# Patient Record
Sex: Male | Born: 1985 | Race: White | Hispanic: No | Marital: Married | Smoking: Never smoker
Health system: Southern US, Community
[De-identification: ages and names within clinical notes are randomized; demographics above are authoritative.]

---

## 2019-03-29 ENCOUNTER — Emergency Department (HOSPITAL_BASED_OUTPATIENT_CLINIC_OR_DEPARTMENT_OTHER)
Admission: EM | Admit: 2019-03-29 | Discharge: 2019-03-30 | Disposition: A | Discharge status: Home / Self Care | Payer: BLUE CROSS/BLUE SHIELD | Attending: Emergency Medicine | Admitting: Emergency Medicine

## 2019-03-29 ENCOUNTER — Encounter (HOSPITAL_BASED_OUTPATIENT_CLINIC_OR_DEPARTMENT_OTHER): Payer: Self-pay

## 2019-03-29 ENCOUNTER — Other Ambulatory Visit: Payer: Self-pay

## 2019-03-29 DIAGNOSIS — L259 Unspecified contact dermatitis, unspecified cause: Secondary | ICD-10-CM | POA: Diagnosis not present

## 2019-03-29 DIAGNOSIS — R21 Rash and other nonspecific skin eruption: Secondary | ICD-10-CM | POA: Diagnosis present

## 2019-03-29 MED ORDER — DIPHENHYDRAMINE HCL 25 MG PO TABS: 25.0000 mg | ORAL_TABLET | Freq: Four times a day (QID) | ORAL | 0 refills | Status: DC

## 2019-03-29 MED ORDER — HYDROCORTISONE 1 % EX CREA: TOPICAL_CREAM | CUTANEOUS | 0 refills | Status: AC

## 2019-03-29 MED ORDER — NAPROXEN 500 MG PO TABS: 500.0000 mg | ORAL_TABLET | Freq: Two times a day (BID) | ORAL | 0 refills | Status: AC

## 2019-03-29 MED ORDER — DIPHENHYDRAMINE HCL 25 MG PO CAPS
25.0000 mg | ORAL_CAPSULE | Freq: Once | ORAL | Status: AC
  Administered 2019-03-30: 25 mg via ORAL
  Filled 2019-03-29: qty 1

## 2019-03-29 MED ORDER — DIPHENHYDRAMINE HCL 25 MG PO TABS: 25.0000 mg | ORAL_TABLET | Freq: Four times a day (QID) | ORAL | 0 refills | Status: AC

## 2019-03-29 MED ORDER — HYDROCORTISONE 1 % EX CREA: TOPICAL_CREAM | CUTANEOUS | 0 refills | Status: DC

## 2019-03-29 MED ORDER — NAPROXEN 250 MG PO TABS
500.0000 mg | ORAL_TABLET | Freq: Once | ORAL | Status: AC
  Administered 2019-03-30: 500 mg via ORAL
  Filled 2019-03-29: qty 2

## 2019-03-29 NOTE — ED Provider Notes (Signed)
[facility] EMERGENCY DEPARTMENT Provider Note   CSN: [id number] Arrival date & time: 03/29/19  2136     History   Chief Complaint Chief Complaint  Patient presents with  . Rash    HPI Raymond Gibbs is a 33 y.o. male.     HPI  This is a 33 year old male with no significant past medical history presents with a rash to the right arm.  Patient reports that he was mowing the lawn when his right arm got caught in breast Alem.  He states that since that time he has noted a rash in his right forearm.  It is uncomfortable and at times itchy.  He is unsure if he got bitten.  He has used hydrocortisone cream with some relief.  He also used ice with some relief.  No rash anywhere else.  Denies systemic symptoms.  Currently he rates the discomfort at 6 out of 10.  He reports that he feels like his hand feels strange as well.  Denies weakness but reports tingling.  Denies fevers.  History reviewed. No pertinent past medical history.  There are no active problems to display for this patient.   History reviewed. No pertinent surgical history.      Home Medications    Prior to Admission medications   Medication Sig Start Date End Date Taking? Authorizing Provider  diphenhydrAMINE (BENADRYL) 25 MG tablet Take 1 tablet (25 mg total) by mouth every 6 (six) hours. 03/29/19   Jahden Schara, Barbette Hair, MD  hydrocortisone cream 1 % Apply to affected area 2 times daily 03/29/19   Graham Doukas, Barbette Hair, MD  naproxen (NAPROSYN) 500 MG tablet Take 1 tablet (500 mg total) by mouth 2 (two) times daily. 03/29/19   Debbera Wolken, Barbette Hair, MD    Family History No family history on file.  Social History Social History   Tobacco Use  . Smoking status: Never Smoker  . Smokeless tobacco: Current User    Types: Snuff  Substance Use Topics  . Alcohol use: Never    Frequency: Never  . Drug use: Never     Allergies   Bee venom and Penicillins   Review of Systems Review of Systems  Constitutional:  Negative for fever.  Skin: Positive for color change and rash. Negative for wound.  All other systems reviewed and are negative.    Physical Exam Updated Vital Signs BP (!) 138/99 (BP Location: Right Arm)   Pulse (!) 57   Temp 98.6 F (37 C) (Oral)   Resp 18   Ht 1.905 m (6\' 3" )   Wt 88.5 kg   SpO2 100%   BMI 24.37 kg/m   Physical Exam Vitals signs and nursing note reviewed.  Constitutional:      Appearance: He is well-developed. He is not ill-appearing.  HENT:     Head: Normocephalic and atraumatic.     Mouth/Throat:     Mouth: Mucous membranes are moist.  Cardiovascular:     Rate and Rhythm: Normal rate and regular rhythm.  Pulmonary:     Effort: Pulmonary effort is normal. No respiratory distress.  Musculoskeletal:        General: No tenderness, deformity or signs of injury.  Skin:    General: Skin is warm and dry.     Comments: Blanching erythema noted over the anterior aspect of the proximal right forearm, large patch of erythema, not raised, no bite marks or other skin changes noted  Neurological:     Mental Status: He is  alert and oriented to person, place, and time.  Psychiatric:        Mood and Affect: Mood normal.      ED Treatments / Results  Labs (all labs ordered are listed, but only abnormal results are displayed) Labs Reviewed - No data to display  EKG None  Radiology No results found.  Procedures Procedures (including critical care time)  Medications Ordered in ED Medications  naproxen (NAPROSYN) tablet 500 mg (has no administration in time range)  diphenhydrAMINE (BENADRYL) capsule 25 mg (has no administration in time range)     Initial Impression / Assessment and Plan / ED Course  I have reviewed the triage vital signs and the nursing notes.  Pertinent labs & imaging results that were available during my care of the patient were reviewed by me and considered in my medical decision making (see chart for details).         Patient presents with skin discoloration and rash following contact with a tree limb.  Denies significant injury.  He has a large patch of erythema.  Not raised.  Does not look like urticaria.  Suspect may be a contact dermatitis.  No indication of a bite.  Regardless, recommend continuing topical steroid, Benadryl and anti-inflammatories for itching and discomfort.  After history, exam, and medical workup I feel the patient has been appropriately medically screened and is safe for discharge home. Pertinent diagnoses were discussed with the patient. Patient was given return precautions.   Final Clinical Impressions(s) / ED Diagnoses   Final diagnoses:  Contact dermatitis, unspecified contact dermatitis type, unspecified trigger    ED Discharge Orders         Ordered    hydrocortisone cream 1 %     03/29/19 2352    naproxen (NAPROSYN) 500 MG tablet  2 times daily     03/29/19 2352    diphenhydrAMINE (BENADRYL) 25 MG tablet  Every 6 hours     03/29/19 2353           Shon BatonHorton, Rueben Kassim F, MD 03/29/19 2357

## 2019-03-29 NOTE — Discharge Instructions (Addendum)
You were seen today for a rash.  This is likely related to coming in contact irritating her skin.  Take Benadryl or naproxen as needed for discomfort.  Additionally continue hydrocortisone.  Monitor for signs and symptoms of worsening.

## 2019-03-29 NOTE — ED Triage Notes (Signed)
Pt caught a tree branch in his elbow and thinks he received a bug bite. Pt has an area of urticaria to his forearm that is painful. Pt applied hydrocortisone and ice to the area PTA.

## 2019-03-29 NOTE — ED Notes (Signed)
Pt states arm was pressed into a tree branch and immediately felt a pain in his arm, then rash and pain. Pt reports no sighting of any insect or snake, or anything that may have bitten him. Pt emphasizes that arm is "burning"

## 2021-10-05 ENCOUNTER — Other Ambulatory Visit (HOSPITAL_COMMUNITY): Payer: Self-pay | Admitting: Podiatry

## 2021-10-05 DIAGNOSIS — M25572 Pain in left ankle and joints of left foot: Secondary | ICD-10-CM

## 2021-10-05 DIAGNOSIS — M25571 Pain in right ankle and joints of right foot: Secondary | ICD-10-CM

## 2021-10-16 ENCOUNTER — Ambulatory Visit (HOSPITAL_COMMUNITY)
Admission: RE | Admit: 2021-10-16 | Discharge: 2021-10-16 | Disposition: A | Discharge status: Home / Self Care | Payer: No Typology Code available for payment source | Source: Ambulatory Visit | Attending: Podiatry | Admitting: Podiatry

## 2021-10-16 ENCOUNTER — Other Ambulatory Visit: Payer: Self-pay

## 2021-10-16 ENCOUNTER — Encounter (HOSPITAL_COMMUNITY): Payer: Self-pay

## 2021-10-16 DIAGNOSIS — M25572 Pain in left ankle and joints of left foot: Secondary | ICD-10-CM

## 2021-10-16 DIAGNOSIS — M25571 Pain in right ankle and joints of right foot: Secondary | ICD-10-CM

## 2021-10-31 ENCOUNTER — Ambulatory Visit (HOSPITAL_COMMUNITY): Admission: RE | Admit: 2021-10-31 | Payer: No Typology Code available for payment source | Source: Ambulatory Visit

## 2021-10-31 ENCOUNTER — Encounter (HOSPITAL_COMMUNITY): Payer: Self-pay

## 2021-11-02 ENCOUNTER — Ambulatory Visit (HOSPITAL_COMMUNITY)
Admission: RE | Admit: 2021-11-02 | Discharge: 2021-11-02 | Disposition: A | Discharge status: Home / Self Care | Payer: No Typology Code available for payment source | Source: Ambulatory Visit | Attending: Podiatry | Admitting: Podiatry

## 2021-11-02 DIAGNOSIS — M25571 Pain in right ankle and joints of right foot: Secondary | ICD-10-CM | POA: Insufficient documentation

## 2021-11-02 DIAGNOSIS — M25572 Pain in left ankle and joints of left foot: Secondary | ICD-10-CM | POA: Insufficient documentation

## 2021-11-02 IMAGING — MR MR ANKLE*L* W/O CM
5 series · 39 of 40 positions shown · non-contrast
Comparison: None.

CLINICAL DATA: Left ankle pain, unspecified chronicity. History of
fracture.

EXAM:
MRI OF THE LEFT ANKLE WITHOUT CONTRAST
TECHNIQUE: Multiplanar, multisequence MR imaging of the ankle was performed. No
intravenous contrast was administered.

[Series 2: T2 fat-sat · axial · left · 4.0mm · 0.50mm/px · z∈[-72,+92]mm · 8 of 34 slices shown (1 of 2)]
[im 1/34]
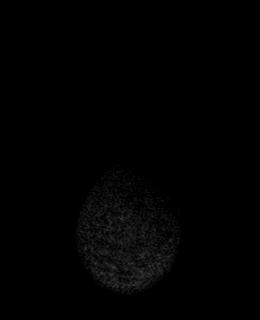
[im 5/34]
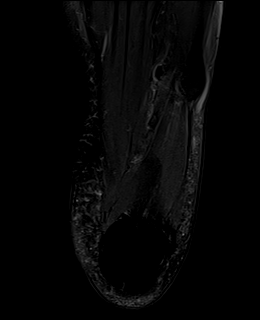
[im 10/34]
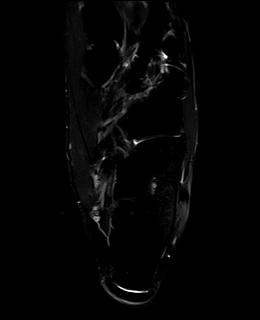
[im 15/34]
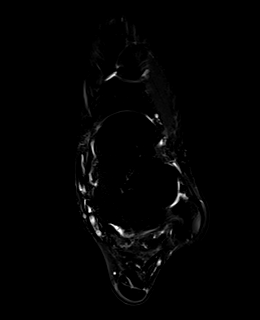
[im 19/34]
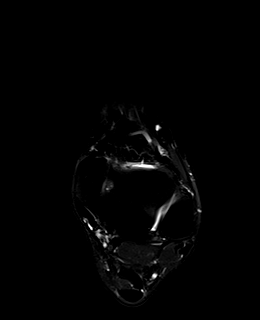
[im 24/34]
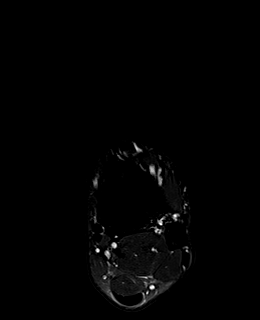
[im 29/34]
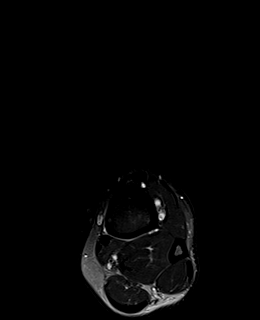
[im 34/34]
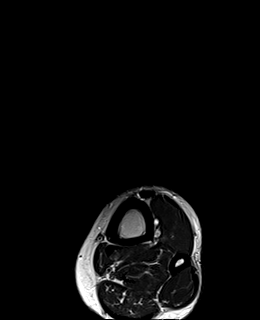

[Series 3: PD fat-sat · axial · left · 4.0mm · 0.50mm/px · z∈[-72,+92]mm · 9 of 34 slices shown]
[im 1/34]
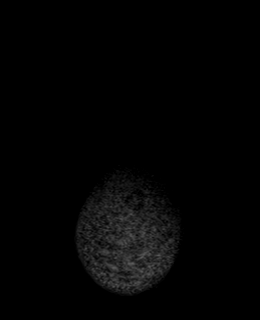
[im 5/34]
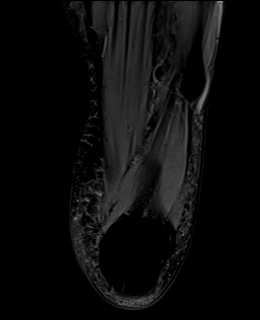
[im 9/34]
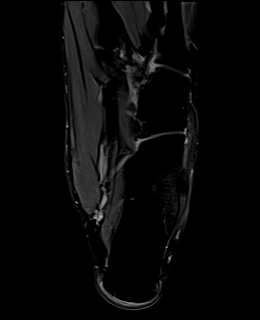
[im 13/34]
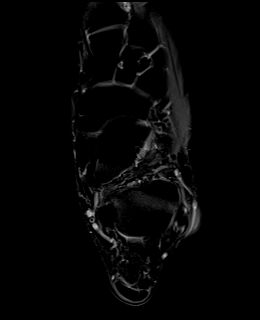
[im 17/34]
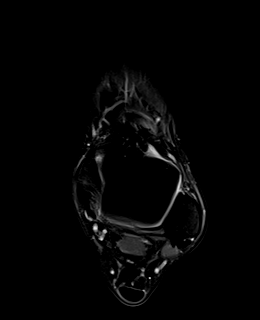
[im 21/34]
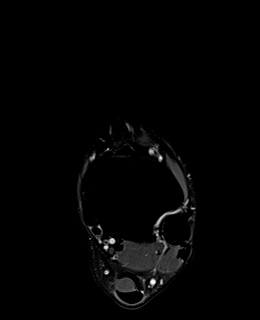
[im 25/34]
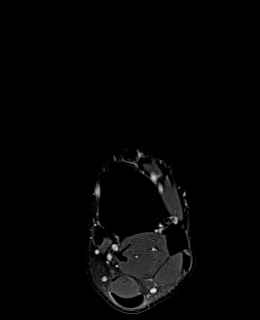
[im 29/34]
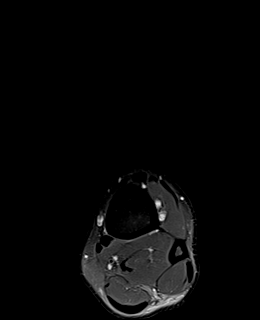
[im 34/34]
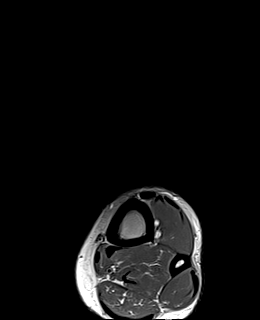

[Series 4: T2 fat-sat · coronal · left · 3.0mm · 0.50mm/px · 9 of 34 slices shown (2 of 2)]
[im 1/34]
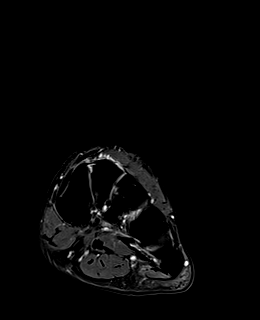
[im 5/34]
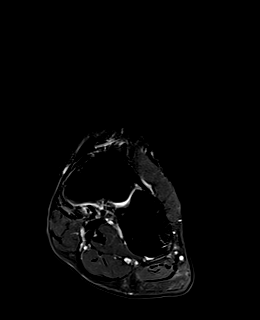
[im 9/34]
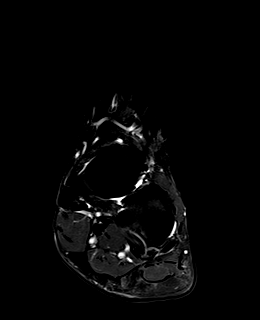
[im 13/34]
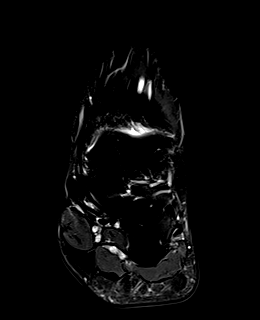
[im 17/34]
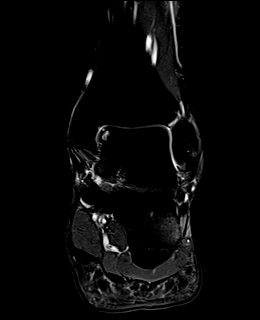
[im 21/34]
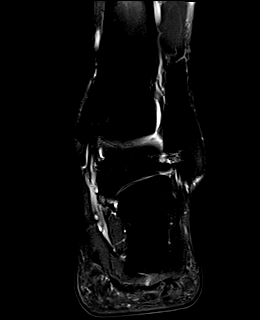
[im 25/34]
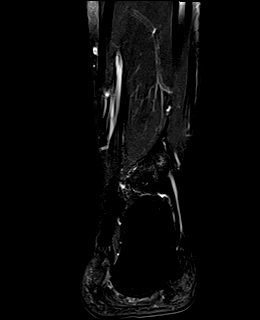
[im 29/34]
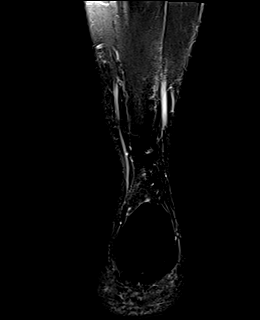
[im 34/34]
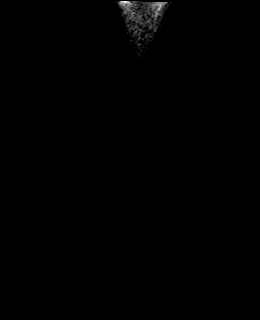

[Series 5: T1 · sagittal · left · 3.0mm · 0.50mm/px · 7 of 25 slices shown]
[im 1/25]
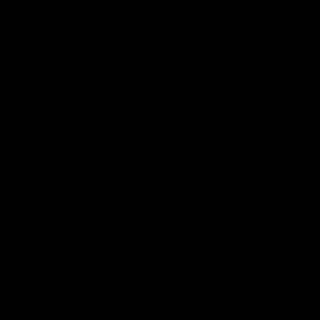
[im 5/25]
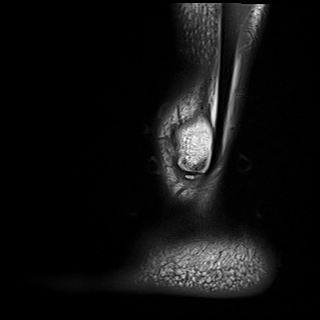
[im 9/25]
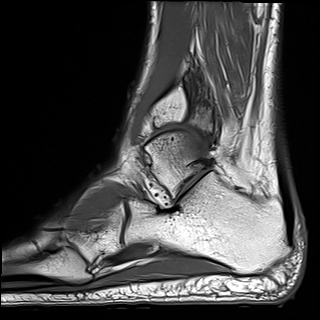
[im 13/25]
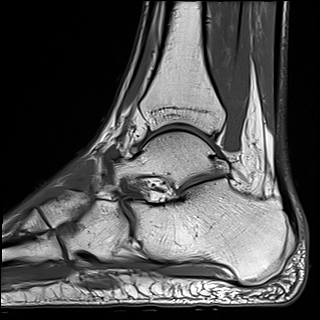
[im 17/25]
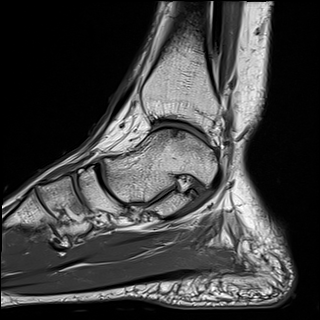
[im 21/25]
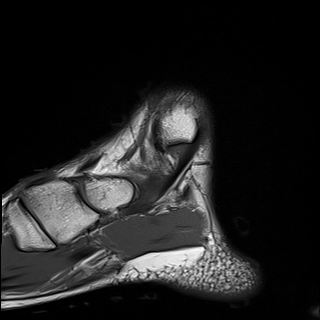
[im 25/25]
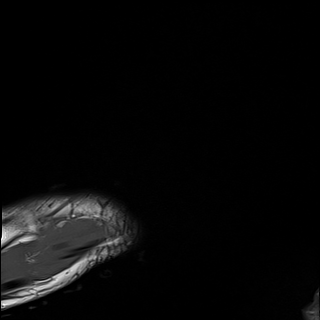

[Series 6: STIR · sagittal · left · 3.0mm · 0.31mm/px · 6 of 25 slices shown]
[im 1/25]
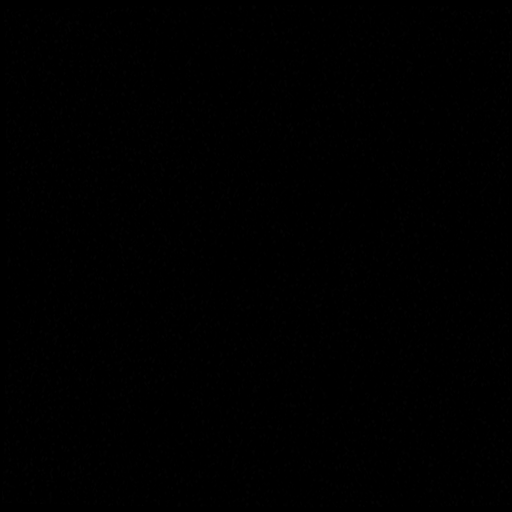
[im 5/25]
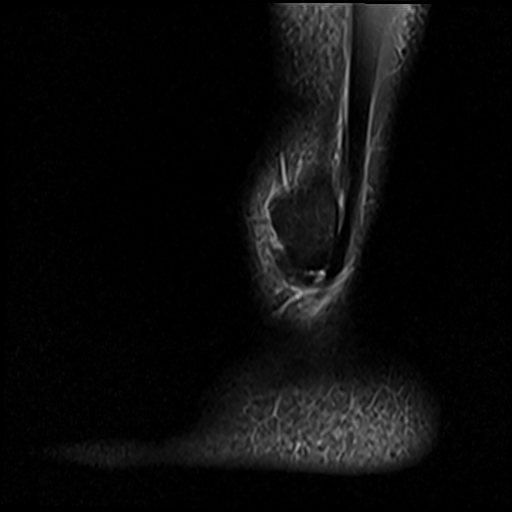
[im 9/25]
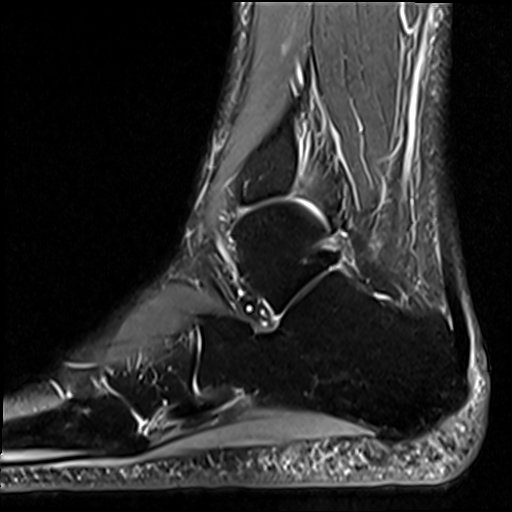
[im 13/25]
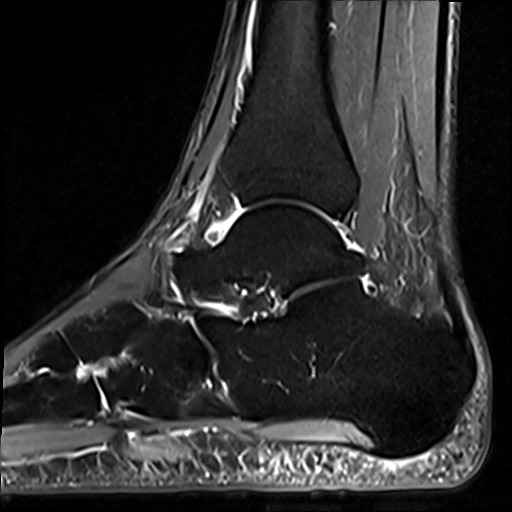
[im 17/25]
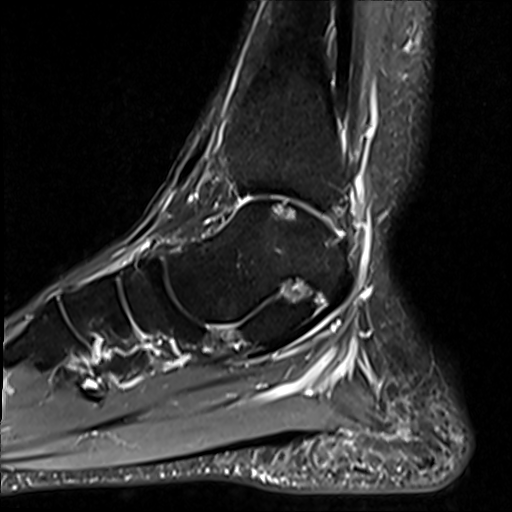
[im 21/25]
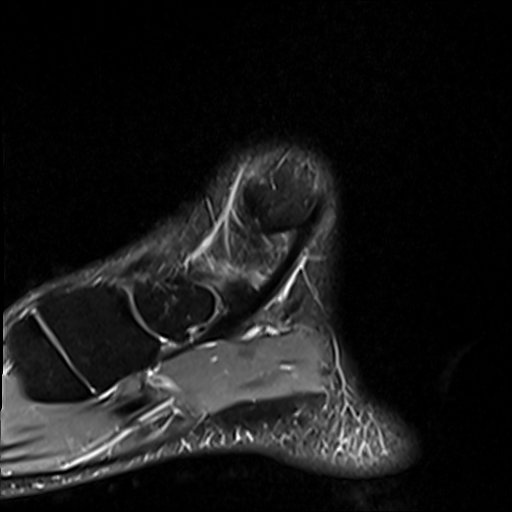

[39 of 40 positions shown; findings below may reference images not displayed]

FINDINGS: TENDONS

Peroneal: Mild flattening of the peroneus brevis tendon at the level
of the lateral malleolus without discrete lung renal split tear or
significant tenosynovitis. The peroneus longus tendon appears
normal.

Posteromedial: Intact and normally positioned.

Anterior: Intact and normally positioned.

Achilles: Intact.

Plantar Fascia: Intact.

LIGAMENTS

Lateral: The anterior and posterior talofibular and calcaneofibular
ligaments are intact.The inferior tibiofibular ligaments appear
intact.

Medial: The deltoid and visualized portions of the spring ligament
appear intact.

CARTILAGE AND BONES

Ankle Joint: No significant ankle joint effusion. There is
subchondral cyst formation medially in the talar dome which appears
degenerative, measuring up to 9 mm on image [DATE]. No unstable or
displaced osteochondral fragment identified.

Subtalar Joints/Sinus Tarsi: Unremarkable.

Bones: No acute osseous findings are identified. As above,
subchondral cyst formation medially in the talar dome. There is
fragmented spurring of the lateral malleolus which is likely related
to remote injury.

Other: Probable tiny ganglia along the dorsal aspect of the midfoot
at the articulation between the navicular and middle cuneiform.
IMPRESSION: 1. Subchondral cyst formation medially in the talar dome which
appears degenerative. No unstable or displaced osteochondral
fragment. No acute osseous findings.
2. The ankle tendons and ligaments appear intact.

## 2021-11-02 IMAGING — MR MR ANKLE*R* W/O CM
5 series · 37 of 40 positions shown · non-contrast
Comparison: None.

CLINICAL DATA: Right ankle pain, unspecified chronicity.

EXAM:
MRI OF THE RIGHT ANKLE WITHOUT CONTRAST
TECHNIQUE: Multiplanar, multisequence MR imaging of the ankle was performed. No
intravenous contrast was administered.

[Series 2: PD fat-sat · axial · right · 4.0mm · 0.50mm/px · z∈[-89,+40]mm · 8 of 27 slices shown]
[im 1/27]
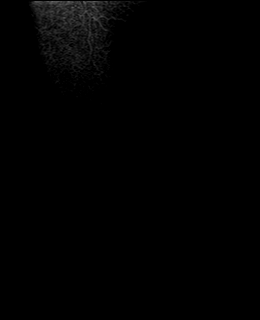
[im 4/27]
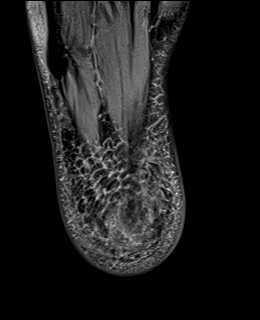
[im 8/27]
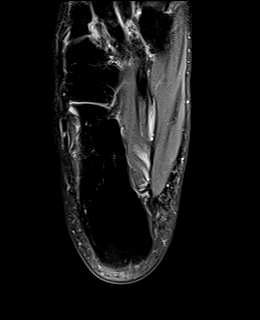
[im 12/27]
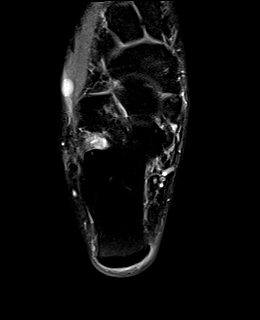
[im 15/27]
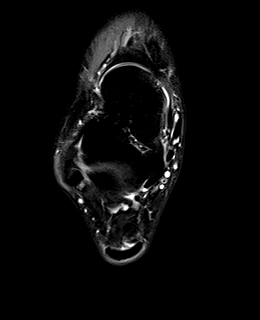
[im 19/27]
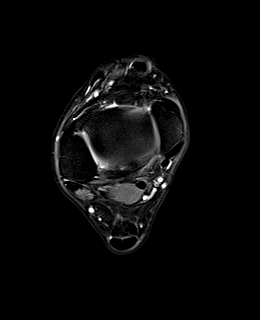
[im 23/27]
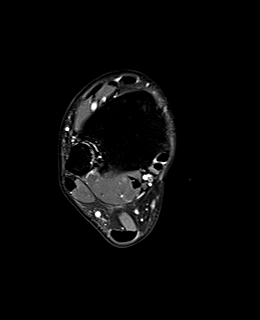
[im 27/27]
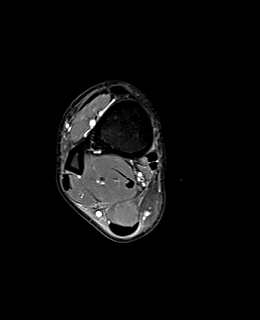

[Series 3: T2 fat-sat · axial · right · 4.0mm · 0.42mm/px · z∈[-89,+40]mm · 8 of 27 slices shown (1 of 2)]
[im 1/27]
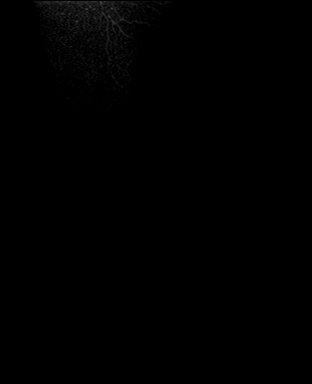
[im 4/27]
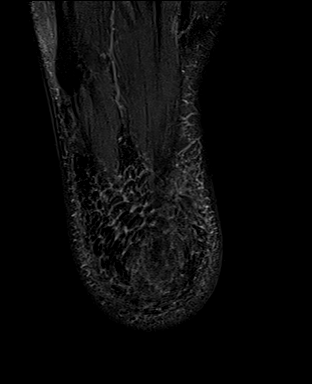
[im 8/27]
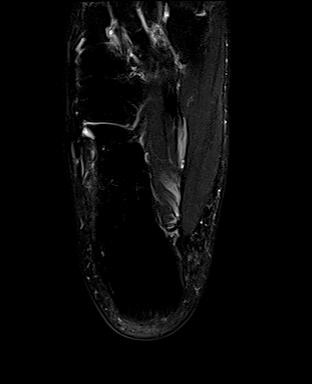
[im 12/27]
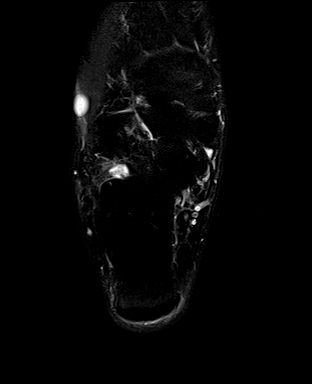
[im 15/27]
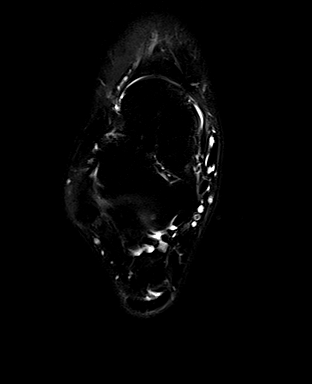
[im 19/27]
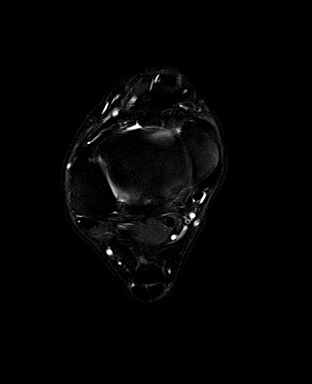
[im 23/27]
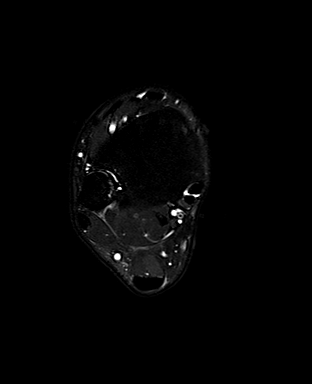
[im 27/27]
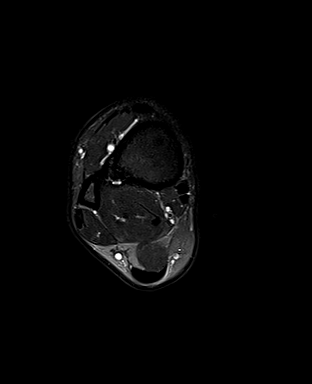

[Series 4: T2 fat-sat · coronal · right · 3.0mm · 0.50mm/px · 8 of 35 slices shown (2 of 2)]
[im 1/35]
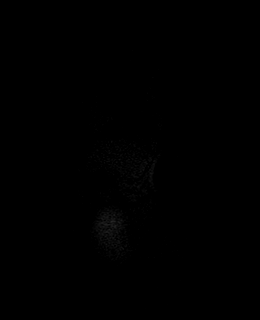
[im 4/35]
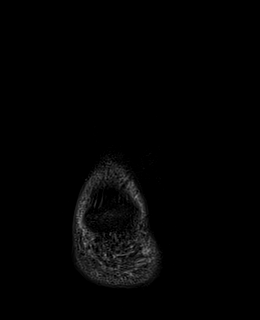
[im 12/35]
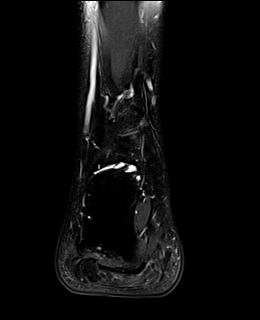
[im 16/35]
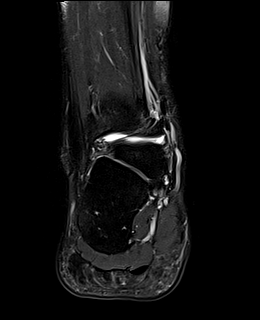
[im 19/35]
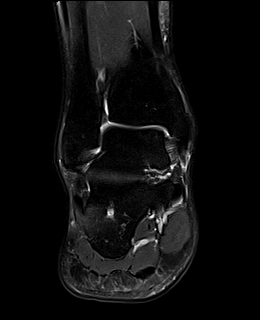
[im 23/35]
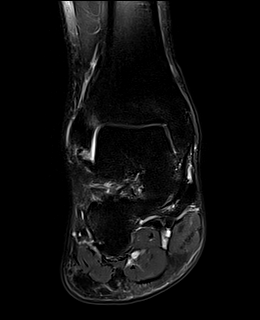
[im 31/35]
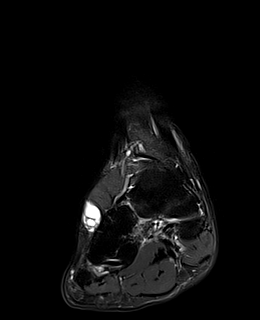
[im 35/35]
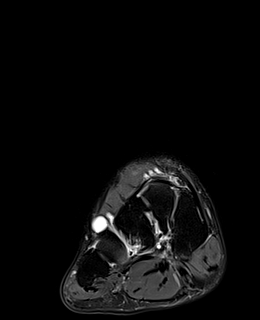

[Series 5: T1 · sagittal · right · 3.0mm · 0.50mm/px · 7 of 25 slices shown]
[im 1/25]
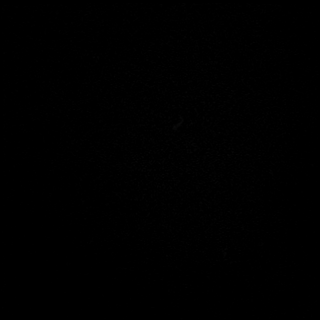
[im 5/25]
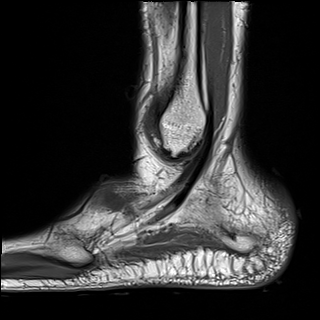
[im 9/25]
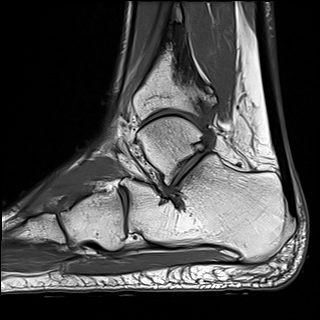
[im 13/25]
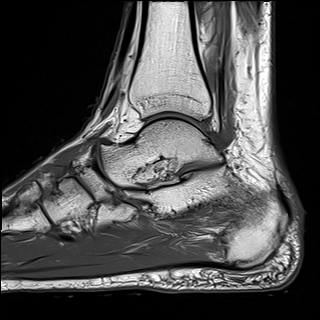
[im 17/25]
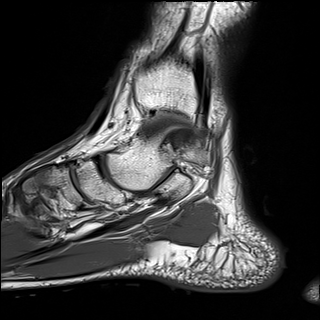
[im 21/25]
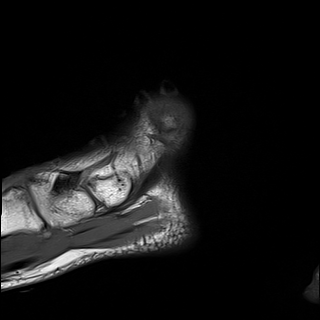
[im 25/25]
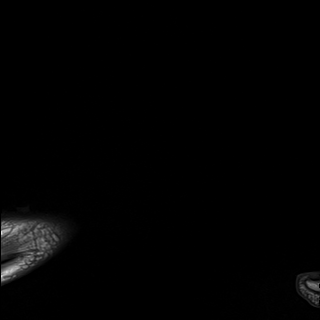

[Series 6: STIR · sagittal · right · 3.0mm · 0.31mm/px · 6 of 25 slices shown]
[im 1/25]
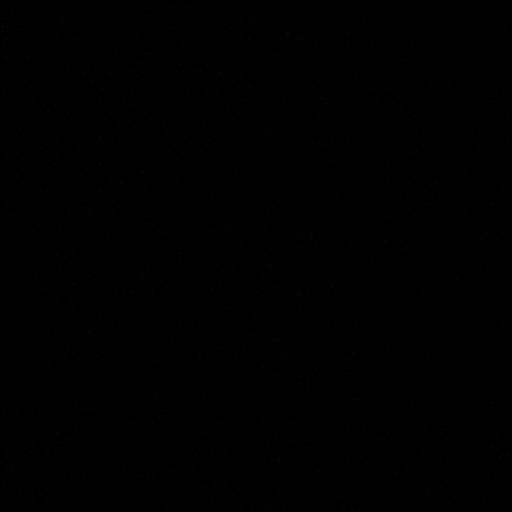
[im 5/25]
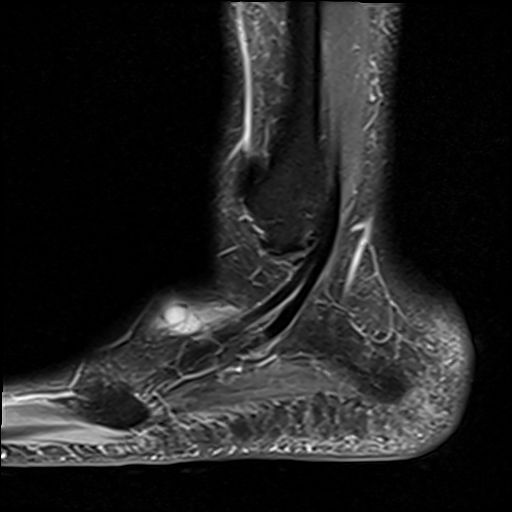
[im 9/25]
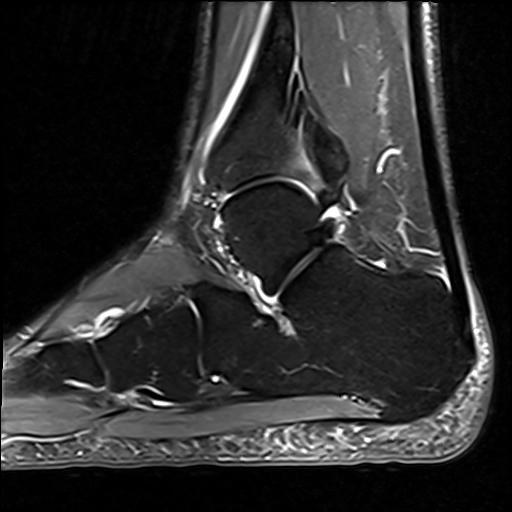
[im 13/25]
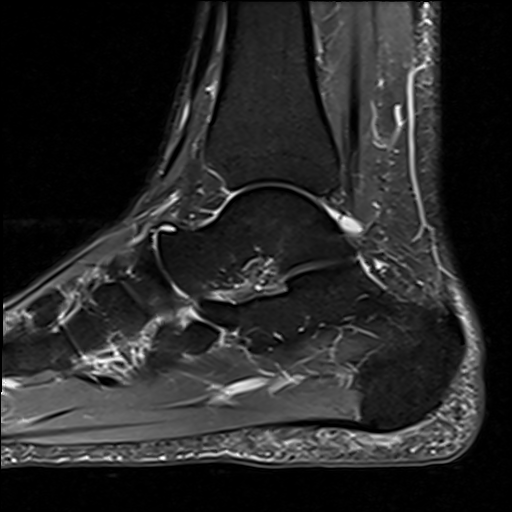
[im 17/25]
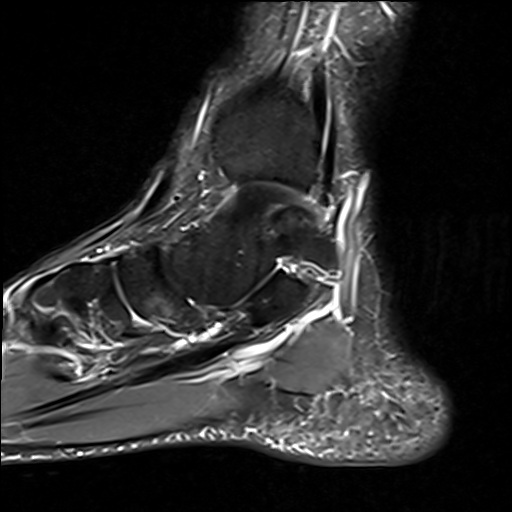
[im 21/25]
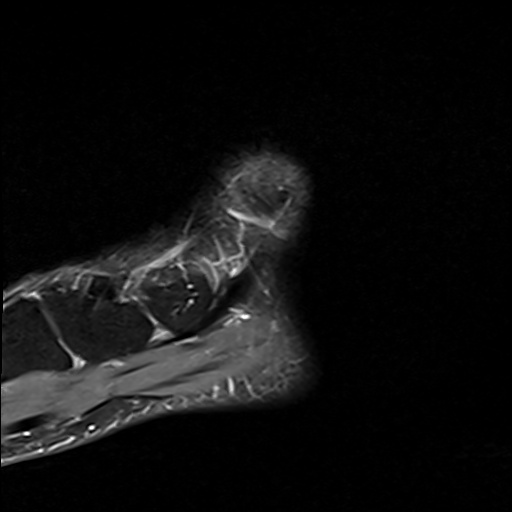

[37 of 40 positions shown; findings below may reference images not displayed]

FINDINGS: TENDONS

Peroneal: Intact and normally positioned.

Posteromedial: Intact and normally positioned.

Anterior: Intact and normally positioned.

Achilles: Intact.

Plantar Fascia: Intact. There is mild fusiform thickening of the
central cord of the plantar fascia proximally 2.5 cm distal to its
calcaneal attachment, but no discrete plantar fibroma.

LIGAMENTS

Lateral: The anterior and posterior talofibular and calcaneofibular
ligaments are intact.The inferior tibiofibular ligaments appear
intact.

Medial: The deltoid and visualized portions of the spring ligament
appear intact.

CARTILAGE AND BONES

Ankle Joint: No significant ankle joint effusion. The talar dome and
tibial plafond are intact.

Subtalar Joints/Sinus Tarsi: Unremarkable.

Bones: No significant extra-articular osseous findings. Small
prominent vascular remnant within the calcaneus noted incidentally.

Other: There is a multi-septated cystic structure along the dorsal
lateral aspect of the cuboid and 4th metatarsal base which is most
consistent with a ganglion. This measures approximately 4.3 x 0.8 x
1.5 cm. No associated osseous erosion or other significant soft
tissue abnormality.
IMPRESSION: 1. Large septated ganglion along the dorsal lateral aspect of the
right midfoot as described.
2. Mild nonspecific thickening of the central cord of the plantar
fascia without discrete fibroma.
3. The ankle tendons and ligaments are intact. No acute
osteochondral findings.

## 2022-08-17 ENCOUNTER — Encounter: Payer: Self-pay | Admitting: Nurse Practitioner

## 2022-08-17 ENCOUNTER — Ambulatory Visit (INDEPENDENT_AMBULATORY_CARE_PROVIDER_SITE_OTHER): Payer: No Typology Code available for payment source | Admitting: Nurse Practitioner

## 2022-08-17 VITALS — BP 148/90 | HR 91 | Temp 98.4°F | Ht 75.0 in | Wt 220.2 lb

## 2022-08-17 DIAGNOSIS — R0683 Snoring: Secondary | ICD-10-CM | POA: Diagnosis not present

## 2022-08-17 DIAGNOSIS — G4733 Obstructive sleep apnea (adult) (pediatric): Secondary | ICD-10-CM | POA: Diagnosis not present

## 2022-08-17 DIAGNOSIS — F5105 Insomnia due to other mental disorder: Secondary | ICD-10-CM | POA: Insufficient documentation

## 2022-08-17 NOTE — Progress Notes (Signed)
@Patient  ID: Raymond Gibbs, male    DOB: Jan 12, 1986, 37 y.o.   MRN: [id number]  Chief Complaint  Patient presents with   Consult    Pt states does not sleep at night.  Was in special ops in [employer]  A lot of anxiety.  Sx since 2008 when joined [employer].      Referring provider: Landry Mellow, MD  HPI: 37 year old male, never smoker referred for sleep consult.  Past medical history significant for hypertension, anxiety, PTSD, fibromyalgia, IBS.  TEST/EVENTS:   08/17/2022: Today-sleep consult Patient presents today for sleep consult, referred by Dr. Ronnald Ramp. He has a longstanding history of trouble sleeping at night.  He has difficulties falling and staying asleep ever since he joined the [employer] in 2008. He discussed this with his primary care provider at the [place], who ordered a sleep study, which he completed in October 2023. He had very mild OSA with AHI 5.3/h. He was referred here to talk about treatment options. He tells me that he's tired during the day and he does snore some. He tends to only sleep for a few hours at a time and then due to pain from his fibromyalgia or anxiety symptoms, he will wake up and can't fall back asleep. His mind races before he ever falls asleep. He started working with a Social worker around 2 years ago, which he does feel is helping. He was prescribed Wellbutrin in October but never started this. He prefers to not take medications if he doesn't have to. He has tried melatonin before but it made him feel very loopy and groggy. He's not sure what dose this was. He's never been on prescription sleep aids. He denies drowsy driving, sleep parasomnias/paralysis. No history of narcolepsy or cataplexy. He denies any SI/HI. Goes to bed between 11 PM and 2 AM.  Takes hours to fall asleep.  Wakes multiple times at night.  Officially gets up around 530 to 6 AM.  Weight has been stable over the last 2 years.  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  Had a sleep  study 3 months ago. He has a history of high blood pressure, HLD and allergies.  No history of stroke.   He is a never smoker.  Chews tobacco.  Does not drink alcohol.  Works as a Engineer, drilling.  Lives with his wife and children.  No significant family history.  [place] 4  Allergies  Allergen Reactions   Bee Venom Hives   Penicillins Other (See Comments)    unknown     There is no immunization history on file for this patient.  No past medical history on file.  Tobacco History: Social History   Tobacco Use  Smoking Status Never  Smokeless Tobacco Current   Types: Chew   Ready to quit: Not Answered Counseling given: Not Answered   Outpatient Medications Prior to Visit  Medication Sig Dispense Refill   buPROPion ER (WELLBUTRIN SR) 100 MG 12 hr tablet TAKE ONE TABLET BY MOUTH TWICE A DAY FOR TOBACCO CESSATION, CONCENTRATION     dicyclomine (BENTYL) 10 MG capsule Take 10 mg by mouth 4 (four) times daily -  before meals and at bedtime.     diphenhydrAMINE (BENADRYL) 25 MG tablet Take 1 tablet (25 mg total) by mouth every 6 (six) hours. 20 tablet 0   lisinopril (ZESTRIL) 10 MG tablet Take 10 mg by mouth daily.     nicotine polacrilex (COMMIT) 2 MG lozenge Take 2 mg  by mouth as needed.     hydrocortisone cream 1 % Apply to affected area 2 times daily (Patient not taking: Reported on 08/17/2022) 15 g 0   naproxen (NAPROSYN) 500 MG tablet Take 1 tablet (500 mg total) by mouth 2 (two) times daily. (Patient not taking: Reported on 08/17/2022) 30 tablet 0   No facility-administered medications prior to visit.     Review of Systems:   Constitutional: No weight loss or gain, night sweats, fevers, chills, or lassitude. +daytime fatigue HEENT: No difficulty swallowing, tooth/dental problems, or sore throat. No sneezing, itching, ear ache, nasal congestion, or post nasal drip. +headaches, nasal congestion. CV:  No chest pain, orthopnea, PND, swelling in lower extremities,  anasarca, dizziness, palpitations, syncope Resp: +snoring. No shortness of breath with exertion or at rest. No excess mucus or change in color of mucus. No productive or non-productive. No hemoptysis. No wheezing.  No chest wall deformity GI:  +heartburn, indigestion. No abdominal pain, nausea, vomiting, diarrhea, change in bowel habits, loss of appetite GU: No dysuria, change in color of urine, urgency or frequency.   Skin: No rash, lesions, ulcerations MSK:  +chronic joint pain. No joint swelling.   Neuro: No dizziness or lightheadedness.  Psych: +depression, anxiety. No SI/HI. Mood stable. +sleep disturbance.    Physical Exam:  BP (!) 148/90 (BP Location: Right Arm, Patient Position: Sitting, Cuff Size: Large)   Pulse 91   Temp 98.4 F (36.9 C) (Oral)   Ht 6\' 3"  (1.905 m)   Wt 220 lb 3.2 oz (99.9 kg)   SpO2 98%   BMI 27.52 kg/m   GEN: Pleasant, interactive, well-appearing; in no acute distress. HEENT:  Normocephalic and atraumatic. PERRLA. Sclera white. Nasal turbinates pink, moist and patent bilaterally. No rhinorrhea present. Oropharynx pink and moist, without exudate or edema. No lesions, ulcerations, or postnasal drip. Mallampati II NECK:  Supple w/ fair ROM. No JVD present. Normal carotid impulses w/o bruits. Thyroid symmetrical with no goiter or nodules palpated. No lymphadenopathy.   CV: RRR, no m/r/g, no peripheral edema. Pulses intact, +2 bilaterally. No cyanosis, pallor or clubbing. PULMONARY:  Unlabored, regular breathing. Clear bilaterally A&P w/o wheezes/rales/rhonchi. No accessory muscle use.  GI: BS present and normoactive. Soft, non-tender to palpation. No organomegaly or masses detected. MSK: No erythema, warmth or tenderness. Cap refil <2 sec all extrem. No deformities or joint swelling noted.  Neuro: A/Ox3. No focal deficits noted.   Skin: Warm, no lesions or rashe Psych: Normal affect and behavior. Judgement and thought content appropriate.     Lab  Results:  CBC No results found for: "WBC", "RBC", "HGB", "HCT", "PLT", "MCV", "MCH", "MCHC", "RDW", "LYMPHSABS", "MONOABS", "EOSABS", "BASOSABS"  BMET No results found for: "NA", "K", "CL", "CO2", "GLUCOSE", "BUN", "CREATININE", "CALCIUM", "GFRNONAA", "GFRAA"  BNP No results found for: "BNP"   Imaging:  No results found.        No data to display          No results found for: "NITRICOXIDE"      Assessment & Plan:   Mild obstructive sleep apnea Very mild OSA with AHI 5.3, 5.4 in supine. With the mild severity of his OSA and his anxiety/PTSD, we opted to hold on CPAP therapy. Reviewed potential treatment options and shared decision to move forward with oral appliance. We did discuss that these can be costly. Referral placed today. Side lying sleeping position encouraged. I think the majority of his insomnia is related to anxiety/PTSD, not untreated OSA.   Patient  Instructions  We discussed how untreated sleep apnea puts an individual at risk for cardiac arrhthymias, pulm HTN, DM, stroke and increases their risk for daytime accidents; however, you have extremely mild sleep apnea so these are minimal. We also briefly reviewed treatment options including weight loss, side sleeping position, oral appliance, CPAP therapy  Referral to orthodontics for oral appliance fitting  Continue to work with your counselor and primary care provider  Try over the counter sleep aid or melatonin at the lowest dose to see if this helps with your sleep  Follow up in 6 months with Dr. Wynona Neat (new pt 30 min slot) or Katie Estephan Gallardo,NP. If symptoms do not improve or worsen, please contact office for sooner follow up    Insomnia due to mental disorder Lengthy discussion surrounding his mental health and correlation with insomnia. Currently working with counselor and primary care on his anxiety and overall mood. He was prescribed Wellbutrin in October but has yet to start this. Reviewed potential  medications options but I encouraged him to address the underlying causes as well. He would like to hold off on prescription pharmacological therapy at this point. He will try a lower dose of melatonin or other OTC sleep aid and see if this helps. Reviewed sleep hygiene measures.   I spent 45 minutes of dedicated to the care of this patient on the date of this encounter to include pre-visit review of records, face-to-face time with the patient discussing conditions above, post visit ordering of testing, clinical documentation with the electronic health record, making appropriate referrals as documented, and communicating necessary findings to members of the patients care team.  Noemi Chapel, NP 08/17/2022  Pt aware and understands NP's role.

## 2022-08-17 NOTE — Patient Instructions (Signed)
We discussed how untreated sleep apnea puts an individual at risk for cardiac arrhthymias, pulm HTN, DM, stroke and increases their risk for daytime accidents; however, you have extremely mild sleep apnea so these are minimal. We also briefly reviewed treatment options including weight loss, side sleeping position, oral appliance, CPAP therapy  Referral to orthodontics for oral appliance fitting  Continue to work with your counselor and primary care provider  Try over the counter sleep aid or melatonin at the lowest dose to see if this helps with your sleep  Follow up in 6 months with Dr. Ander Slade (new pt 30 min slot) or Katie Lazlo Tunney,NP. If symptoms do not improve or worsen, please contact office for sooner follow up

## 2022-08-17 NOTE — Assessment & Plan Note (Signed)
Very mild OSA with AHI 5.3, 5.4 in supine. With the mild severity of his OSA and his anxiety/PTSD, we opted to hold on CPAP therapy. Reviewed potential treatment options and shared decision to move forward with oral appliance. We did discuss that these can be costly. Referral placed today. Side lying sleeping position encouraged. I think the majority of his insomnia is related to anxiety/PTSD, not untreated OSA.   Patient Instructions  We discussed how untreated sleep apnea puts an individual at risk for cardiac arrhthymias, pulm HTN, DM, stroke and increases their risk for daytime accidents; however, you have extremely mild sleep apnea so these are minimal. We also briefly reviewed treatment options including weight loss, side sleeping position, oral appliance, CPAP therapy  Referral to orthodontics for oral appliance fitting  Continue to work with your counselor and primary care provider  Try over the counter sleep aid or melatonin at the lowest dose to see if this helps with your sleep  Follow up in 6 months with Dr. Ander Slade (new pt 30 min slot) or Katie Myquan Schaumburg,NP. If symptoms do not improve or worsen, please contact office for sooner follow up

## 2022-08-17 NOTE — Addendum Note (Signed)
Addended byOralia Rud M on: 08/17/2022 02:14 PM   Modules accepted: Orders

## 2022-08-17 NOTE — Assessment & Plan Note (Addendum)
Lengthy discussion surrounding his mental health and correlation with insomnia. Currently working with counselor and primary care on his anxiety and overall mood. He was prescribed Wellbutrin in October but has yet to start this. Reviewed potential medications options but I encouraged him to address the underlying causes as well. He would like to hold off on prescription pharmacological therapy at this point. He will try a lower dose of melatonin or other OTC sleep aid and see if this helps. Reviewed sleep hygiene measures.

## 2022-08-20 ENCOUNTER — Other Ambulatory Visit (HOSPITAL_BASED_OUTPATIENT_CLINIC_OR_DEPARTMENT_OTHER): Payer: Self-pay

## 2022-08-20 DIAGNOSIS — G4730 Sleep apnea, unspecified: Secondary | ICD-10-CM
